# Patient Record
Sex: Male | Born: 1980 | Race: White | Hispanic: No | Marital: Married | Smoking: Never smoker
Health system: Southern US, Community
[De-identification: ages and names within clinical notes are randomized; demographics above are authoritative.]

## PROBLEM LIST (undated history)

## (undated) HISTORY — PX: WISDOM TOOTH EXTRACTION: SHX21

---

## 2017-01-30 NOTE — Progress Notes (Signed)
01/31/2017 1:55 PM   Glenn Kane 26-May-1981 [id number]  Referring provider: No referring provider defined for this encounter.  Chief Complaint  Patient presents with  . VAS Consult    HPI: 36 year old male who presents today for evaluation for possible vasectomy.  He and his wife have 2 elementary school age children and desired no further biological pregnancies. 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  He has no previous history of any scrotal pathology, scrotal trauma, or scrotal pain.  No history of undescended testicles or scrotal surgery.   PMH: History reviewed. No pertinent past medical history.  Surgical History: Past Surgical History:  Procedure Laterality Date  . WISDOM TOOTH EXTRACTION      Home Medications:  Allergies as of 01/31/2017   No Known Allergies     Medication List       Accurate as of 01/31/17  1:55 PM. Always use your most recent med list.          diazepam 10 MG tablet Commonly known as:  VALIUM Take 1 tablet (10 mg total) by mouth every 6 (six) hours as needed for anxiety. Take one tab 1 hour prior to procedue       Allergies: No Known Allergies  Family History: Family History  Problem Relation Age of Onset  . Prostate cancer Father   . Prostate cancer Paternal Grandfather     Social History:  reports that he has never smoked. He has never used smokeless tobacco. He reports that he drinks alcohol. He reports that he does not use drugs.  ROS: UROLOGY Frequent Urination?: No Hard to postpone urination?: No Burning/pain with urination?: No Get up at night to urinate?: No Leakage of urine?: No Urine stream starts and stops?: No Trouble starting stream?: No Do you have to strain to urinate?: No Blood in urine?: No Urinary tract infection?: No Sexually transmitted disease?: No Injury to kidneys or bladder?: No Painful intercourse?: No Weak stream?: No Erection problems?: No Penile pain?:  No  Gastrointestinal Nausea?: No Vomiting?: No Indigestion/heartburn?: No Diarrhea?: No Constipation?: No  Constitutional Fever: No Night sweats?: No Weight loss?: No Fatigue?: No  Skin Skin rash/lesions?: No Itching?: No  Eyes Blurred vision?: No Double vision?: No  Ears/Nose/Throat Sore throat?: No Sinus problems?: No  Hematologic/Lymphatic Swollen glands?: No Easy bruising?: No  Cardiovascular Leg swelling?: No Chest pain?: No  Respiratory Cough?: No Shortness of breath?: No  Endocrine Excessive thirst?: No  Musculoskeletal Back pain?: No Joint pain?: No  Neurological Headaches?: No Dizziness?: No  Psychologic Depression?: No Anxiety?: No  Physical Exam: BP 131/81 (BP Location: Left Arm, Patient Position: Sitting, Cuff Size: Normal)   Pulse 73   Ht 6\' 1"  (1.854 m)   Wt 204 lb 1.6 oz (92.6 kg)   BMI 26.93 kg/m   Constitutional:  Alert and oriented, No acute distress. HEENT: [place] AT, moist mucus membranes.  Trachea midline, no masses. Cardiovascular: No clubbing, cyanosis, or edema. Respiratory: Normal respiratory effort, no increased work of breathing. GI: Abdomen is soft, nontender, nondistended, no abdominal masses GU: Normal circumcised phallus. Bilateral descended testicles, no masses. Somewhat tight scrotum with a posteriorly located vasa bilaterally. Skin: No rashes, bruises or suspicious lesions. Lymph: No cervical or inguinal adenopathy. Neurologic: Grossly intact, no focal deficits, moving all 4 extremities. Psychiatric: Normal mood and affect.  Laboratory Data: N/a  Assessment & Plan:    1. Vasectomy evaluation Today, we discussed what the vas deferens is, where it is located, and  its function. We reviewed the procedure for vasectomy, it's risks, benefits, alternatives, and likelihood of achieving his goals. We discussed in detail the procedure, complications, and recovery as well as the need for clearance prior to unprotected  intercourse. We discussed that vasectomy does not protect against sexually transmitted diseases. We discussed that this procedure does not result in immediate sterility and that they would need to use other forms of birth control until he has been cleared with negative postvasectomy semen analyses. I explained that the procedure is considered to be permanent and that attempts at reversal have varying degrees of success. These options include vasectomy reversal, sperm retrieval, and in vitro fertilization; these can be very expensive. We discussed the chance of postvasectomy pain syndrome which occurs in less than 5% of patients. I explained to the patient that there is no treatment to resolve this chronic pain, and that if it developed I would not be able to help resolve the issue, but that surgery is generally not needed for correction. I explained there have even been reports of systemic like illness associated with this chronic pain, and that there was no good cure. I explained that vasectomy it is not a 100% reliable form of birth control, and the risk of pregnancy after vasectomy is approximately 1 in 2000 men who had a negative postvasectomy semen analysis or rare non-motile sperm. I explained that repeat vasectomy was necessary in less than 1% of vasectomy procedures when employing the type of technique that I use. I explained that he should refrain from ejaculation for approximately one week following vasectomy. I explained that there are other options for birth control which are permanent and non-permanent; we discussed these. I explained the rates of surgical complications, such as symptomatic hematoma or infection, are low (1-2%) and vary with the surgeon's experience and criteria used to diagnose the complication.  The patient had the opportunity to ask questions to his stated satisfaction. He voiced understanding of the above factors and stated that he has read all the information provided to him and  the packets and informed consent.  Precription for Valium 10 mg given to the patient. He will have a driver on the day of the procedure. All additional questions were answered.  Schedule vasectomy.  Vanna ScotlandAshley Kobi Mario, MD  [facility] [address], [address] [place], [place] [address] [phone]

## 2017-01-31 ENCOUNTER — Encounter: Payer: Self-pay | Admitting: Urology

## 2017-01-31 ENCOUNTER — Ambulatory Visit (INDEPENDENT_AMBULATORY_CARE_PROVIDER_SITE_OTHER): Payer: PRIVATE HEALTH INSURANCE | Admitting: Urology

## 2017-01-31 VITALS — BP 131/81 | HR 73 | Ht 73.0 in | Wt 204.1 lb

## 2017-01-31 DIAGNOSIS — Z3009 Encounter for other general counseling and advice on contraception: Secondary | ICD-10-CM

## 2017-01-31 MED ORDER — DIAZEPAM 10 MG PO TABS: 10.0000 mg | ORAL_TABLET | Freq: Four times a day (QID) | ORAL | 0 refills | Status: DC | PRN

## 2017-02-07 ENCOUNTER — Encounter: Payer: Self-pay | Admitting: Urology

## 2017-02-07 ENCOUNTER — Ambulatory Visit (INDEPENDENT_AMBULATORY_CARE_PROVIDER_SITE_OTHER): Payer: PRIVATE HEALTH INSURANCE | Admitting: Urology

## 2017-02-07 VITALS — BP 126/75 | HR 75 | Wt 201.0 lb

## 2017-02-07 DIAGNOSIS — Z302 Encounter for sterilization: Secondary | ICD-10-CM | POA: Diagnosis not present

## 2017-02-07 NOTE — Progress Notes (Signed)
02/07/17  CC:  Chief Complaint  Patient presents with  . VAS    HPI: 36 year old male who presents today for scheduled vasectomy. Risks and benefits or previous reviewed and reviewed again today in detail. Consent had been previously signed. All question answered prior to the procedure.  Blood pressure 126/75, pulse 75, weight 201 lb (91.2 kg). NED. A&Ox3.   No respiratory distress   Abd soft, NT, ND Normal external genitalia with patent urethral meatus   Bilateral Vasectomy Procedure  Pre-Procedure: - Patient's scrotum was prepped and draped for vasectomy. - The vas was palpated through the scrotal skin on the left. - 1% Xylocaine was injected into the skin and surrounding tissue for placement  - In a similar manner, the vas on the right was identified, anesthetized, and stabilized.  Procedure: - An #11 blade was used to open the overlying skin - The left vas was isolated and brought up through the incision exposing that structure. - Bleeding points were cauterized as they occurred. - The vas was free from the surrounding structures and brought to the view. - A segment was positioned for placement with a hemostat. - A second hemostat was placed and a small segment between the two hemostats and was removed for inspection. - Each end of the transected vas lumen was fulgurated/ obliterated using needlepoint electrocautery -A fascial interposition was performed on testicular end of the vas using #3-0 chromic suture -The same procedure was performed on the right. - A single suture of #3-0 chromic catgut was used to close each lateral scrotal skin incision - A dressing was applied.  Post-Procedure: - Patient was instructed in care of the operative area - A specimen is to be delivered in 12 weeks   -Another form of contraception is to be used until post vasectomy semen analysis  Vanna ScotlandAshley Meriam Chojnowski, MD

## 2017-05-15 ENCOUNTER — Other Ambulatory Visit: Payer: PRIVATE HEALTH INSURANCE

## 2017-05-15 DIAGNOSIS — Z9852 Vasectomy status: Secondary | ICD-10-CM

## 2017-05-17 LAB — POST-VAS SPERM EVALUATION,QUAL: Volume: 2.2 mL

## 2017-05-19 ENCOUNTER — Telehealth: Payer: Self-pay

## 2017-05-19 NOTE — Telephone Encounter (Signed)
-----   Message from Vanna ScotlandAshley Brandon, MD sent at 05/17/2017  1:52 PM EDT ----- No sperm.  Good to go.    Vanna ScotlandAshley Brandon, MD

## 2017-05-19 NOTE — Telephone Encounter (Signed)
Called pt. No answer °

## 2017-05-20 NOTE — Telephone Encounter (Signed)
Called patient to give lab results. No answer. Left vmail per DPR.  

## 2018-07-09 ENCOUNTER — Other Ambulatory Visit: Payer: Self-pay

## 2018-07-09 ENCOUNTER — Emergency Department: Payer: BC Managed Care – PPO | Admitting: Anesthesiology

## 2018-07-09 ENCOUNTER — Inpatient Hospital Stay: Admit: 2018-07-09 | Payer: BC Managed Care – PPO | Admitting: Surgery

## 2018-07-09 ENCOUNTER — Observation Stay
Admission: EM | Admit: 2018-07-09 | Discharge: 2018-07-10 | Disposition: A | Discharge status: Home / Self Care | Payer: BC Managed Care – PPO | Attending: Surgery | Admitting: Surgery

## 2018-07-09 ENCOUNTER — Encounter
Admission: EM | Disposition: A | Discharge status: Home / Self Care | Payer: Self-pay | Source: Home / Self Care | Attending: Emergency Medicine

## 2018-07-09 ENCOUNTER — Emergency Department: Payer: BC Managed Care – PPO

## 2018-07-09 ENCOUNTER — Encounter: Payer: Self-pay | Admitting: Intensive Care

## 2018-07-09 DIAGNOSIS — K3580 Unspecified acute appendicitis: Principal | ICD-10-CM | POA: Diagnosis present

## 2018-07-09 HISTORY — PX: LAPAROSCOPIC APPENDECTOMY: SHX408

## 2018-07-09 SURGERY — APPENDECTOMY, LAPAROSCOPIC
Anesthesia: General

## 2018-07-09 MED ORDER — FENTANYL CITRATE (PF) 100 MCG/2ML IJ SOLN
INTRAMUSCULAR | Status: AC
  Filled 2018-07-09: qty 2

## 2018-07-09 MED ORDER — BUPIVACAINE-EPINEPHRINE (PF) 0.25% -1:200000 IJ SOLN
INTRAMUSCULAR | Status: AC
Start: 2018-07-09 — End: ?
  Filled 2018-07-09: qty 30

## 2018-07-09 MED ORDER — HYDROCODONE-ACETAMINOPHEN 5-325 MG PO TABS
1.0000 | ORAL_TABLET | ORAL | Status: DC | PRN
  Administered 2018-07-09: 1 via ORAL
  Administered 2018-07-10: 2 via ORAL
  Filled 2018-07-09 (×3): qty 1

## 2018-07-09 MED ORDER — PROPOFOL 10 MG/ML IV BOLUS
INTRAVENOUS | Status: DC | PRN
  Administered 2018-07-09: 160 mg via INTRAVENOUS

## 2018-07-09 MED ORDER — DEXAMETHASONE SODIUM PHOSPHATE 10 MG/ML IJ SOLN
INTRAMUSCULAR | Status: DC | PRN
  Administered 2018-07-09: 10 mg via INTRAVENOUS

## 2018-07-09 MED ORDER — SUCCINYLCHOLINE CHLORIDE 20 MG/ML IJ SOLN
INTRAMUSCULAR | Status: DC | PRN
  Administered 2018-07-09: 100 mg via INTRAVENOUS

## 2018-07-09 MED ORDER — ACETAMINOPHEN 10 MG/ML IV SOLN
INTRAVENOUS | Status: AC
  Filled 2018-07-09: qty 100

## 2018-07-09 MED ORDER — SUGAMMADEX SODIUM 200 MG/2ML IV SOLN
INTRAVENOUS | Status: DC | PRN
  Administered 2018-07-09: 200 mg via INTRAVENOUS

## 2018-07-09 MED ORDER — FENTANYL CITRATE (PF) 100 MCG/2ML IJ SOLN
INTRAMUSCULAR | Status: AC
  Administered 2018-07-09: 25 ug via INTRAVENOUS
  Filled 2018-07-09: qty 2

## 2018-07-09 MED ORDER — FENTANYL CITRATE (PF) 100 MCG/2ML IJ SOLN
25.0000 ug | INTRAMUSCULAR | Status: DC | PRN
  Administered 2018-07-09 (×4): 25 ug via INTRAVENOUS

## 2018-07-09 MED ORDER — LIDOCAINE HCL (CARDIAC) PF 100 MG/5ML IV SOSY
PREFILLED_SYRINGE | INTRAVENOUS | Status: DC | PRN
  Administered 2018-07-09: 100 mg via INTRAVENOUS

## 2018-07-09 MED ORDER — DEXTROSE-NACL 5-0.9 % IV SOLN
INTRAVENOUS | Status: DC
  Administered 2018-07-09: 22:00:00 via INTRAVENOUS

## 2018-07-09 MED ORDER — ROCURONIUM BROMIDE 100 MG/10ML IV SOLN
INTRAVENOUS | Status: AC
  Filled 2018-07-09: qty 1

## 2018-07-09 MED ORDER — BUPIVACAINE-EPINEPHRINE (PF) 0.25% -1:200000 IJ SOLN
INTRAMUSCULAR | Status: DC | PRN
  Administered 2018-07-09: 30 mL via PERINEURAL

## 2018-07-09 MED ORDER — LIDOCAINE HCL (PF) 2 % IJ SOLN
INTRAMUSCULAR | Status: AC
  Filled 2018-07-09: qty 10

## 2018-07-09 MED ORDER — ONDANSETRON HCL 4 MG/2ML IJ SOLN: 4.0000 mg | Freq: Once | INTRAMUSCULAR | Status: DC | PRN

## 2018-07-09 MED ORDER — IOHEXOL 300 MG/ML  SOLN
100.0000 mL | Freq: Once | INTRAMUSCULAR | Status: AC | PRN
  Administered 2018-07-09: 100 mL via INTRAVENOUS
  Filled 2018-07-09: qty 100

## 2018-07-09 MED ORDER — ROCURONIUM BROMIDE 100 MG/10ML IV SOLN
INTRAVENOUS | Status: DC | PRN
  Administered 2018-07-09: 35 mg via INTRAVENOUS
  Administered 2018-07-09: 5 mg via INTRAVENOUS

## 2018-07-09 MED ORDER — DEXAMETHASONE SODIUM PHOSPHATE 10 MG/ML IJ SOLN
INTRAMUSCULAR | Status: AC
  Filled 2018-07-09: qty 1

## 2018-07-09 MED ORDER — ONDANSETRON HCL 4 MG PO TABS: 4.0000 mg | ORAL_TABLET | Freq: Four times a day (QID) | ORAL | Status: DC | PRN

## 2018-07-09 MED ORDER — SUCCINYLCHOLINE CHLORIDE 20 MG/ML IJ SOLN
INTRAMUSCULAR | Status: AC
  Filled 2018-07-09: qty 1

## 2018-07-09 MED ORDER — ONDANSETRON HCL 4 MG/2ML IJ SOLN
INTRAMUSCULAR | Status: AC
  Filled 2018-07-09: qty 2

## 2018-07-09 MED ORDER — MIDAZOLAM HCL 2 MG/2ML IJ SOLN
INTRAMUSCULAR | Status: DC | PRN
  Administered 2018-07-09: 2 mg via INTRAVENOUS

## 2018-07-09 MED ORDER — FENTANYL CITRATE (PF) 100 MCG/2ML IJ SOLN
INTRAMUSCULAR | Status: DC | PRN
  Administered 2018-07-09: 100 ug via INTRAVENOUS

## 2018-07-09 MED ORDER — HYDROMORPHONE HCL 1 MG/ML IJ SOLN
0.5000 mg | INTRAMUSCULAR | Status: DC | PRN
  Administered 2018-07-10: 0.5 mg via INTRAVENOUS
  Filled 2018-07-09: qty 0.5

## 2018-07-09 MED ORDER — PIPERACILLIN-TAZOBACTAM 3.375 G IVPB 30 MIN
3.3750 g | Freq: Once | INTRAVENOUS | Status: AC
  Administered 2018-07-09: 3.375 g via INTRAVENOUS
  Filled 2018-07-09 (×2): qty 50

## 2018-07-09 MED ORDER — PROPOFOL 10 MG/ML IV BOLUS
INTRAVENOUS | Status: AC
  Filled 2018-07-09: qty 20

## 2018-07-09 MED ORDER — ONDANSETRON HCL 4 MG/2ML IJ SOLN
4.0000 mg | Freq: Four times a day (QID) | INTRAMUSCULAR | Status: DC | PRN
Start: 2018-07-09 — End: 2018-07-10

## 2018-07-09 MED ORDER — LACTATED RINGERS IV SOLN
INTRAVENOUS | Status: DC | PRN
  Administered 2018-07-09: 19:00:00 via INTRAVENOUS

## 2018-07-09 MED ORDER — ONDANSETRON HCL 4 MG/2ML IJ SOLN
INTRAMUSCULAR | Status: DC | PRN
  Administered 2018-07-09: 4 mg via INTRAVENOUS

## 2018-07-09 MED ORDER — ACETAMINOPHEN 10 MG/ML IV SOLN
INTRAVENOUS | Status: DC | PRN
  Administered 2018-07-09: 1000 mg via INTRAVENOUS

## 2018-07-09 MED ORDER — SUGAMMADEX SODIUM 200 MG/2ML IV SOLN
INTRAVENOUS | Status: AC
  Filled 2018-07-09: qty 2

## 2018-07-09 MED ORDER — MIDAZOLAM HCL 2 MG/2ML IJ SOLN
INTRAMUSCULAR | Status: AC
  Filled 2018-07-09: qty 2

## 2018-07-09 SURGICAL SUPPLY — 42 items
ADHESIVE MASTISOL STRL (MISCELLANEOUS) ×3 IMPLANT
APPLIER CLIP ROT 10 11.4 M/L (STAPLE) ×3
BLADE SURG SZ11 CARB STEEL (BLADE) ×3 IMPLANT
CANISTER SUCT 3000ML PPV (MISCELLANEOUS) ×3 IMPLANT
CHLORAPREP W/TINT 26ML (MISCELLANEOUS) ×3 IMPLANT
CLIP APPLIE ROT 10 11.4 M/L (STAPLE) ×1 IMPLANT
CLOSURE WOUND 1/2 X4 (GAUZE/BANDAGES/DRESSINGS) ×1
CUTTER FLEX LINEAR 45M (STAPLE) ×3 IMPLANT
DEVICE TROCAR PUNCTURE CLOSURE (ENDOMECHANICALS) ×3 IMPLANT
ELECT REM PT RETURN 9FT ADLT (ELECTROSURGICAL) ×3
ELECTRODE REM PT RTRN 9FT ADLT (ELECTROSURGICAL) ×1 IMPLANT
GLOVE BIO SURGEON STRL SZ8 (GLOVE) ×6 IMPLANT
GOWN STRL REUS W/ TWL LRG LVL3 (GOWN DISPOSABLE) ×2 IMPLANT
GOWN STRL REUS W/TWL LRG LVL3 (GOWN DISPOSABLE) ×4
IRRIGATION STRYKERFLOW (MISCELLANEOUS) ×1 IMPLANT
IRRIGATOR STRYKERFLOW (MISCELLANEOUS) ×3
KIT TURNOVER KIT A (KITS) ×3 IMPLANT
LABEL OR SOLS (LABEL) IMPLANT
NEEDLE HYPO 22GX1.5 SAFETY (NEEDLE) ×3 IMPLANT
NEEDLE VERESS 14GA 120MM (NEEDLE) ×3 IMPLANT
NS IRRIG 500ML POUR BTL (IV SOLUTION) ×3 IMPLANT
PACK LAP CHOLECYSTECTOMY (MISCELLANEOUS) ×3 IMPLANT
POUCH SPECIMEN RETRIEVAL 10MM (ENDOMECHANICALS) ×3 IMPLANT
RELOAD 45 VASCULAR/THIN (ENDOMECHANICALS) ×6 IMPLANT
RELOAD STAPLE TA45 3.5 REG BLU (ENDOMECHANICALS) ×3 IMPLANT
SCISSORS METZENBAUM CVD 33 (INSTRUMENTS) IMPLANT
SLEEVE ENDOPATH XCEL 5M (ENDOMECHANICALS) ×3 IMPLANT
SLEEVE SCD COMPRESS THIGH MED (MISCELLANEOUS) ×3 IMPLANT
SOL .9 NS 3000ML IRR  AL (IV SOLUTION) ×2
SOL .9 NS 3000ML IRR UROMATIC (IV SOLUTION) ×1 IMPLANT
SPONGE GAUZE 2X2 8PLY STER LF (GAUZE/BANDAGES/DRESSINGS) ×3
SPONGE GAUZE 2X2 8PLY STRL LF (GAUZE/BANDAGES/DRESSINGS) ×6 IMPLANT
SPONGE LAP 18X18 RF (DISPOSABLE) ×3 IMPLANT
STRIP CLOSURE SKIN 1/2X4 (GAUZE/BANDAGES/DRESSINGS) ×2 IMPLANT
SUT MNCRL 4-0 (SUTURE) ×2
SUT MNCRL 4-0 27XMFL (SUTURE) ×1
SUT VICRYL 0 TIES 12 18 (SUTURE) ×3 IMPLANT
SUTURE MNCRL 4-0 27XMF (SUTURE) ×1 IMPLANT
TRAY FOLEY MTR SLVR 16FR STAT (SET/KITS/TRAYS/PACK) ×3 IMPLANT
TROCAR XCEL 12X100 BLDLESS (ENDOMECHANICALS) ×3 IMPLANT
TROCAR XCEL NON-BLD 5MMX100MML (ENDOMECHANICALS) ×3 IMPLANT
TUBING INSUFFLATION (TUBING) ×3 IMPLANT

## 2018-07-09 NOTE — ED Notes (Signed)
Pt denies n/v/d. States he has not eaten anything today. NAD noted.

## 2018-07-09 NOTE — Anesthesia Postprocedure Evaluation (Signed)
Anesthesia Post Note  Patient: Rosezetta Schlatterhomas Allred  Procedure(s) Performed: APPENDECTOMY LAPAROSCOPIC (N/A )  Patient location during evaluation: PACU Anesthesia Type: General Level of consciousness: awake and alert Pain management: pain level controlled Vital Signs Assessment: post-procedure vital signs reviewed and stable Respiratory status: spontaneous breathing and respiratory function stable Cardiovascular status: stable Anesthetic complications: no     Last Vitals:  Vitals:   07/09/18 2105 07/09/18 2115  BP: 112/69 118/74  Pulse: 60 (!) 53  Resp: (!) 0 18  Temp:  36.7 C  SpO2: 96% 100%    Last Pain:  Vitals:   07/09/18 2222  TempSrc:   PainSc: 3                  KEPHART,WILLIAM K

## 2018-07-09 NOTE — Anesthesia Post-op Follow-up Note (Signed)
Anesthesia QCDR form completed.        

## 2018-07-09 NOTE — Op Note (Signed)
laparascopic appendectomy   Glenn Kane Date of operation:  07/09/2018  Indications: The patient presented with a history of  abdominal pain. Workup has revealed findings consistent with acute appendicitis.  Pre-operative Diagnosis: Acute appendicitis  Post-operative Diagnosis: Acute appendicitis, nonruptured  Surgeon: Adah Salvageichard E. Excell Seltzerooper, MD, FACS  Anesthesia: General with endotracheal tube  Procedure Details  The patient was seen again in the preop area. The options of surgery versus observation were reviewed with the patient and/or family. The risks of bleeding, infection, recurrence of symptoms, negative laparoscopy, potential for an open procedure, bowel injury, abscess or infection, were all reviewed as well. The patient was taken to Operating Room, identified as Glenn Kane and the procedure verified as laparoscopic appendectomy. A Time Out was held and the above information confirmed.  The patient was placed in the supine position and general anesthesia was induced.  Antibiotic prophylaxis was administered and VT E prophylaxis was in place. A Foley catheter was placed by the nursing staff.   The abdomen was prepped and draped in a sterile fashion. An infraumbilical incision was made. A Veress needle was placed and pneumoperitoneum was obtained. A 5 mm trocar port was placed without difficulty and the abdominal cavity was explored.  Under direct vision a 5 mm suprapubic port was placed and a 13 mm left lateral port was placed all under direct vision.  The appendix was identified and found to be acutely inflamed and in the retrocecal position as suggested by CT scan. The appendix was carefully dissected. The base of the appendix was dissected out and divided with a standard load Endo GIA. The mesoappendix was divided with a vascular load Endo GIA.  Multiple clips were required along the staple line for arterial bleeding. The appendix was passed out through the left lateral port site  with the aid of an Endo Catch bag. The right lower quadrant and pelvis was then irrigated with copious amounts of normal saline which was aspirated. Inspection  failed to identify any additional bleeding and there were no signs of bowel injury. Therefore the left lateral port site was closed under direct vision utilizing an Endo Close technique with 0 Vicryl interrupted sutures, all under direct vision.   Again the right lower quadrant was inspected there was no sign of bleeding or bowel injury therefore pneumoperitoneum was released, all ports were removed and the skin incisions were approximated with subcuticular 4-0 Monocryl. Steri-Strips and Mastisol and sterile dressings were placed.  The patient tolerated the procedure well, there were no complications. The sponge lap and needle count were correct at the end of the procedure.  The patient was taken to the recovery room in stable condition to be admitted for continued care.  Findings: Acute appendicitis nonruptured in a retrocecal position  Estimated Blood Loss: 25 cc                  Specimens: appendix         Complications: None                  Glenn Kane E. Excell Seltzerooper MD, FACS

## 2018-07-09 NOTE — H&P (Signed)
Glenn Kane is an 37 y.o. male.    Chief Complaint: Right flank pain  HPI: This patient with 2 to 3 days of right flank and right lower quadrant pain.  A work-up in the ED suggested acute appendicitis.  He is never had an episode like this before he has no fevers or chills no nausea or vomiting and no diarrhea. He has no past medical history and no past surgical history other than a sebaceous cyst in college.  He is a Education officer, communitydentist  History reviewed. No pertinent past medical history.  Past Surgical History:  Procedure Laterality Date  . WISDOM TOOTH EXTRACTION      Family History  Problem Relation Age of Onset  . Prostate cancer Father   . Prostate cancer Paternal Grandfather    Social History:  reports that he has never smoked. He has never used smokeless tobacco. He reports that he drinks alcohol. He reports that he does not use drugs.  Allergies: No Known Allergies   (Not in a hospital admission)   Review of Systems  Constitutional: Negative.   HENT: Negative.   Eyes: Negative.   Respiratory: Negative.   Cardiovascular: Negative.   Gastrointestinal: Positive for abdominal pain. Negative for diarrhea, heartburn, nausea and vomiting.  Genitourinary: Negative.   Musculoskeletal: Negative.   Skin: Negative.   Neurological: Negative.   Endo/Heme/Allergies: Negative.   Psychiatric/Behavioral: Negative.      Physical Exam:  BP (!) 138/93 (BP Location: Left Arm)   Pulse 79   Temp 98.1 F (36.7 C) (Oral)   Resp 14   Ht 6\' 1"  (1.854 m)   Wt 90.7 kg   SpO2 100%   BMI 26.39 kg/m   Physical Exam  Constitutional: He is oriented to person, place, and time. He appears well-developed and well-nourished.  Non-toxic appearance. He does not appear ill. No distress.  HENT:  Head: Normocephalic and atraumatic.  Eyes: Pupils are equal, round, and reactive to light. EOM are normal.  Cardiovascular: Normal rate and regular rhythm.  Pulmonary/Chest: Effort normal and breath  sounds normal. No stridor.  Abdominal: Soft. There is tenderness in the right lower quadrant. There is no guarding.  Right lower quadrant and right flank tenderness without mass and a negative Rovsing sign  Genitourinary: Penis normal.  Neurological: He is alert and oriented to person, place, and time.  Skin: Skin is warm and dry.  Vitals reviewed.       No results found for this or any previous visit (from the past 48 hour(s)). Ct Abdomen Pelvis W Contrast  Result Date: 07/09/2018 CLINICAL DATA:  Patient is here for right sided abdominal pain X2 days. [facility] and they sent patient here for elevated WBC and possible appendicitis. No surgery. NKI. EXAM: CT ABDOMEN AND PELVIS WITH CONTRAST TECHNIQUE: Multidetector CT imaging of the abdomen and pelvis was performed using the standard protocol following bolus administration of intravenous contrast. CONTRAST:  100mL OMNIPAQUE IOHEXOL 300 MG/ML  SOLN COMPARISON:  None. FINDINGS: Lower chest: Calcified granuloma at the LEFT lung base. Lung bases are otherwise unremarkable. Heart size is normal. No pericardial effusion or significant coronary artery calcifications. Hepatobiliary: No focal liver abnormality is seen. No radiopaque gallstones, biliary dilatation, or pericholecystic inflammatory changes. Pancreas: Unremarkable. No pancreatic ductal dilatation or surrounding inflammatory changes. Spleen: Normal in size without focal abnormality. Adrenals/Urinary Tract: Symmetric enhancement of both kidneys. No renal mass. No hydronephrosis. Urinary bladder is unremarkable. Stomach/Bowel: The stomach is normal in appearance. Small bowel loops are  nondilated. There is thickening of the retrocecal appendix and associated periappendiceal stranding. The appendix measures 13 millimeters. No appendicolith or abscess in the RIGHT LOWER QUADRANT. Vascular/Lymphatic: No significant vascular findings are present. No enlarged abdominal or pelvic lymph nodes.  Reproductive: Prostate is unremarkable. Other: There is a small amount of free pelvic fluid. Anterior abdominal wall is unremarkable. Musculoskeletal: No acute or significant osseous findings. IMPRESSION: 1. Findings are consistent with acute appendicitis. 2. Appendix: Location: Retrocecal Diameter: 13 millimeters Appendicolith: None Mucosal hyper-enhancement: Present Extraluminal gas: None Periappendiceal collection: Nonspecific stranding and fluid in the RIGHT LOWER QUADRANT and pelvis. No abscess. Electronically Signed   By: Norva Pavlov M.D.   On: 07/09/2018 16:50     Assessment/Plan  CT scan personally reviewed showing a retrocecal appendix.  Recommend laparoscopic appendectomy.  I discussed with him the potential for treating with antibiotics but he has chosen after counseling surgical intervention at this time.  I described for he and his wife the procedure and I discussed the risks of bleeding infection recurrence of symptoms conversion to an open procedure he understood and agreed to proceed  Lattie Haw, MD, FACS

## 2018-07-09 NOTE — Transfer of Care (Signed)
Immediate Anesthesia Transfer of Care Note  Patient: Glenn Kane  Procedure(s) Performed: APPENDECTOMY LAPAROSCOPIC (N/A )  Patient Location: PACU  Anesthesia Type:General  Level of Consciousness: sedated  Airway & Oxygen Therapy: Patient connected to face mask oxygen  Post-op Assessment: Post -op Vital signs reviewed and stable  Post vital signs: stable  Last Vitals:  Vitals Value Taken Time  BP 116/76 07/09/2018  8:16 PM  Temp 36.4 C 07/09/2018  8:16 PM  Pulse 76 07/09/2018  8:16 PM  Resp 19 07/09/2018  8:16 PM  SpO2 100 % 07/09/2018  8:16 PM    Last Pain:  Vitals:   07/09/18 2016  TempSrc: Temporal  PainSc:          Complications: No apparent anesthesia complications

## 2018-07-09 NOTE — Anesthesia Procedure Notes (Signed)
Procedure Name: Intubation Date/Time: 07/09/2018 7:02 PM Performed by: Aline Brochure, CRNA Pre-anesthesia Checklist: Patient identified, Emergency Drugs available, Suction available and Patient being monitored Patient Re-evaluated:Patient Re-evaluated prior to induction Oxygen Delivery Method: Circle system utilized Preoxygenation: Pre-oxygenation with 100% oxygen Induction Type: IV induction Ventilation: Mask ventilation without difficulty Laryngoscope Size: Mac and 4 Grade View: Grade I Tube type: Oral Tube size: 7.5 mm Number of attempts: 1 Airway Equipment and Method: Stylet Placement Confirmation: ETT inserted through vocal cords under direct vision,  positive ETCO2 and breath sounds checked- equal and bilateral Secured at: 21 cm Tube secured with: Tape Dental Injury: Teeth and Oropharynx as per pre-operative assessment

## 2018-07-09 NOTE — ED Provider Notes (Signed)
[facility] Emergency Department Provider Note       Time seen: ----------------------------------------- 4:13 PM on 07/09/2018 -----------------------------------------   I have reviewed the triage vital signs and the nursing notes.  HISTORY   Chief Complaint Abdominal Pain (right sided)    HPI Glenn Kane is a 37 y.o. male with no significant past medical history who presents to the ED for right lower quadrant pain.  Patient presents for right-sided abdominal pain for the past 2 days.  [facility] and he was sent here for evaluation for possible appendicitis.  His white blood cell count was noted to be elevated.  He denies any nausea, vomiting or other complaints.  He does have pain is worse with movement or deep breathing.  History reviewed. No pertinent past medical history.  There are no active problems to display for this patient.   Past Surgical History:  Procedure Laterality Date  . WISDOM TOOTH EXTRACTION      Allergies Patient has no known allergies.  Social History Social History   Tobacco Use  . Smoking status: Never Smoker  . Smokeless tobacco: Never Used  Substance Use Topics  . Alcohol use: Yes    Comment: weekends  . Drug use: No   Review of Systems Constitutional: Negative for fever. Cardiovascular: Negative for chest pain. Respiratory: Negative for shortness of breath. Gastrointestinal: Positive for abdominal pain Musculoskeletal: Negative for back pain. Skin: Negative for rash. Neurological: Negative for headaches, focal weakness or numbness.  All systems negative/normal/unremarkable except as stated in the HPI  ____________________________________________   PHYSICAL EXAM:  VITAL SIGNS: ED Triage Vitals [07/09/18 1526]  Enc Vitals Group     BP 128/78     Pulse Rate 89     Resp 14     Temp 98.1 F (36.7 C)     Temp Source Oral     SpO2 98 %     Weight 200 lb (90.7 kg)      Height 6\' 1"  (1.854 m)     Head Circumference      Peak Flow      Pain Score 6     Pain Loc      Pain Edu?      Excl. in GC?    Constitutional: Alert and oriented. Well appearing and in no distress. Eyes: Conjunctivae are normal. Normal extraocular movements. ENT   Head: Normocephalic and atraumatic.   Nose: No congestion/rhinnorhea.   Mouth/Throat: Mucous membranes are moist.   Neck: No stridor. Cardiovascular: Normal rate, regular rhythm. No murmurs, rubs, or gallops. Respiratory: Normal respiratory effort without tachypnea nor retractions. Breath sounds are clear and equal bilaterally. No wheezes/rales/rhonchi. Gastrointestinal: McBurney's point tenderness, hypoactive bowel sounds Musculoskeletal: Nontender with normal range of motion in extremities. No lower extremity tenderness nor edema. Neurologic:  Normal speech and language. No gross focal neurologic deficits are appreciated.  Skin:  Skin is warm, dry and intact. No rash noted. Psychiatric: Mood and affect are normal. Speech and behavior are normal.  ____________________________________________  ED COURSE:  As part of my medical decision making, I reviewed the following data within the electronic MEDICAL RECORD NUMBER History obtained from family if available, nursing notes, old chart and ekg, as well as notes from prior ED visits. Patient presented for abdominal pain, we will assess with labs and imaging as indicated at this time.   Procedures ____________________________________________   RADIOLOGY Images were viewed by me  Ct abd/pelvis  IMPRESSION: 1. Findings are  consistent with acute appendicitis. 2. Appendix: Location: Retrocecal Diameter: 13 millimeters Appendicolith: None Mucosal hyper-enhancement: Present Extraluminal gas: None Periappendiceal collection: Nonspecific stranding and fluid in the RIGHT LOWER QUADRANT and pelvis. No  abscess. ____________________________________________  DIFFERENTIAL DIAGNOSIS   Appendicitis, renal colic, intestinal pain  FINAL ASSESSMENT AND PLAN  Acute appendicitis   Plan: The patient had presented for right lower quadrant pain. Patient's labs did revealed mild leukocytosis. Patient's imaging revealed retrocecal dilated appendix with stranding consistent with acute appendicitis.  I have ordered IV Zosyn for him and have discussed with Dr. Excell Seltzer from general surgery who will come and evaluate the patient in the ER.   Ulice Dash, MD   Note: This note was generated in part or whole with voice recognition software. Voice recognition is usually quite accurate but there are transcription errors that can and very often do occur. I apologize for any typographical errors that were not detected and corrected.     Emily Filbert, MD 07/09/18 [phone]

## 2018-07-09 NOTE — Progress Notes (Deleted)
Care RN called 2A, Orthopedics charge nurse to conference regarding "short leg splint"; stated that the ED ortho tech needed to have had this done earlier and that staff RN needed to wrap her leg up in ACE wrap and elevated on 2 pillows and ice pack applied; concurred with plan; Peighton Edgin K, RN 8:48 PM 07/09/2018 

## 2018-07-09 NOTE — ED Triage Notes (Signed)
Patient is here for right sided abdominal pain X2 days. [facility] and they sent patient here for elevated WBC and possible appendicitis. Patient able to ambulate with no problems. Denies N/V/D

## 2018-07-09 NOTE — Anesthesia Preprocedure Evaluation (Addendum)
Anesthesia Evaluation  Patient identified by MRN, date of birth, ID band Patient awake    Reviewed: Allergy & Precautions, NPO status , Patient's Chart, lab work & pertinent test results  History of Anesthesia Complications Negative for: history of anesthetic complications  Airway Mallampati: II       Dental   Pulmonary neg sleep apnea, neg COPD,           Cardiovascular (-) hypertension(-) Past MI and (-) CHF (-) dysrhythmias (-) Valvular Problems/Murmurs     Neuro/Psych neg Seizures    GI/Hepatic Neg liver ROS, neg GERD  ,  Endo/Other  neg diabetes  Renal/GU negative Renal ROS     Musculoskeletal   Abdominal   Peds  Hematology   Anesthesia Other Findings   Reproductive/Obstetrics                             Anesthesia Physical Anesthesia Plan  ASA: I and emergent  Anesthesia Plan: General   Post-op Pain Management:    Induction: Intravenous  PONV Risk Score and Plan: 2 and Dexamethasone and Ondansetron  Airway Management Planned: Oral ETT  Additional Equipment:   Intra-op Plan:   Post-operative Plan:   Informed Consent: I have reviewed the patients History and Physical, chart, labs and discussed the procedure including the risks, benefits and alternatives for the proposed anesthesia with the patient or authorized representative who has indicated his/her understanding and acceptance.     Plan Discussed with:   Anesthesia Plan Comments:         Anesthesia Quick Evaluation

## 2018-07-09 NOTE — ED Notes (Signed)
Pt given gown and asked to change into it, removing all his other clothing. Pt also given 2 warm blankets. Answered all pt and spouse's questions.

## 2018-07-10 ENCOUNTER — Encounter: Payer: Self-pay | Admitting: Surgery

## 2018-07-10 MED ORDER — HYDROCODONE-ACETAMINOPHEN 5-325 MG PO TABS: 1.0000 | ORAL_TABLET | Freq: Four times a day (QID) | ORAL | 0 refills | Status: DC | PRN

## 2018-07-10 NOTE — Discharge Instructions (Signed)
Remove dressing in 24 hours. °May shower in 24 hours. °Leave paper strips in place. °Resume all home medications. °Follow-up with Dr. Maymunah Stegemann in 10 days. °

## 2018-07-10 NOTE — Progress Notes (Signed)
Patient cleared for discharge by Dr Excell Seltzerooper      Education complete. AVS printed. Discharge instructions given. All questions answered for patient clarification.  Prescriptions given, pharmacy verified.  IV removed.  Discharged to home via POV

## 2018-07-10 NOTE — Progress Notes (Signed)
1 Day Post-Op  Subjective: Status post laparoscopic appendectomy last night for nonruptured appendicitis.  Today he feels well and wants to eat and be discharged.  Objective: Vital signs in last 24 hours: Temp:  [97.3 F (36.3 C)-98.1 F (36.7 C)] 97.6 F (36.4 C) (08/09 0327) Pulse Rate:  [53-90] 57 (08/09 0327) Resp:  [0-19] 18 (08/09 0327) BP: (112-138)/(69-93) 112/71 (08/09 0327) SpO2:  [95 %-100 %] 99 % (08/09 0327) Weight:  [90.7 kg] 90.7 kg (08/08 1526)    Intake/Output from previous day: 08/08 0701 - 08/09 0700 In: 1145 [I.V.:1090; IV Piggyback:55] Out: 950 [Urine:950] Intake/Output this shift: No intake/output data recorded.  Physical exam:  Abdomen is soft wounds are dressed minimally tender in the left lower quadrant incision site.  Lab Results: CBC  No results for input(s): WBC, HGB, HCT, PLT in the last 72 hours. BMET No results for input(s): NA, K, CL, CO2, GLUCOSE, BUN, CREATININE, CALCIUM in the last 72 hours. PT/INR No results for input(s): LABPROT, INR in the last 72 hours. ABG No results for input(s): PHART, HCO3 in the last 72 hours.  Invalid input(s): PCO2, PO2  Studies/Results: Ct Abdomen Pelvis W Contrast  Result Date: 07/09/2018 CLINICAL DATA:  Patient is here for right sided abdominal pain X2 days. [facility], and they sent patient here for elevated WBC and possible appendicitis. No surgery. NKI. EXAM: CT ABDOMEN AND PELVIS WITH CONTRAST TECHNIQUE: Multidetector CT imaging of the abdomen and pelvis was performed using the standard protocol following bolus administration of intravenous contrast. CONTRAST:  100mL OMNIPAQUE IOHEXOL 300 MG/ML  SOLN COMPARISON:  None. FINDINGS: Lower chest: Calcified granuloma at the LEFT lung base. Lung bases are otherwise unremarkable. Heart size is normal. No pericardial effusion or significant coronary artery calcifications. Hepatobiliary: No focal liver abnormality is seen. No radiopaque gallstones, biliary  dilatation, or pericholecystic inflammatory changes. Pancreas: Unremarkable. No pancreatic ductal dilatation or surrounding inflammatory changes. Spleen: Normal in size without focal abnormality. Adrenals/Urinary Tract: Symmetric enhancement of both kidneys. No renal mass. No hydronephrosis. Urinary bladder is unremarkable. Stomach/Bowel: The stomach is normal in appearance. Small bowel loops are nondilated. There is thickening of the retrocecal appendix and associated periappendiceal stranding. The appendix measures 13 millimeters. No appendicolith or abscess in the RIGHT LOWER QUADRANT. Vascular/Lymphatic: No significant vascular findings are present. No enlarged abdominal or pelvic lymph nodes. Reproductive: Prostate is unremarkable. Other: There is a small amount of free pelvic fluid. Anterior abdominal wall is unremarkable. Musculoskeletal: No acute or significant osseous findings. IMPRESSION: 1. Findings are consistent with acute appendicitis. 2. Appendix: Location: Retrocecal Diameter: 13 millimeters Appendicolith: None Mucosal hyper-enhancement: Present Extraluminal gas: None Periappendiceal collection: Nonspecific stranding and fluid in the RIGHT LOWER QUADRANT and pelvis. No abscess. Electronically Signed   By: Norva PavlovElizabeth  Brown M.D.   On: 07/09/2018 16:50    Anti-infectives: Anti-infectives (From admission, onward)   Start     Dose/Rate Route Frequency Ordered Stop   07/09/18 1730  piperacillin-tazobactam (ZOSYN) IVPB 3.375 g     3.375 g 100 mL/hr over 30 Minutes Intravenous  Once 07/09/18 1703 07/09/18 1745      Assessment/Plan: s/p Procedure(s): APPENDECTOMY LAPAROSCOPIC   Patient doing very well will advance diet and discharge this morning.  He will follow-up in 10 days.  He is given instructions concerning removing his dressings tomorrow and showering tomorrow.  He can return to work when off narcotics.  Lattie Hawichard E Sharifah Champine, MD, FACS  07/10/2018

## 2018-07-11 LAB — HIV ANTIBODY (ROUTINE TESTING W REFLEX): HIV Screen 4th Generation wRfx: NONREACTIVE

## 2018-07-13 LAB — SURGICAL PATHOLOGY

## 2018-07-20 ENCOUNTER — Ambulatory Visit (INDEPENDENT_AMBULATORY_CARE_PROVIDER_SITE_OTHER): Payer: BC Managed Care – PPO | Admitting: Surgery

## 2018-07-20 ENCOUNTER — Encounter: Payer: Self-pay | Admitting: Surgery

## 2018-07-20 VITALS — BP 119/80 | HR 99 | Temp 98.7°F | Wt 207.0 lb

## 2018-07-20 DIAGNOSIS — K3589 Other acute appendicitis without perforation or gangrene: Secondary | ICD-10-CM

## 2018-07-20 NOTE — Progress Notes (Signed)
Outpatient postop visit  07/20/2018  Glenn Kane is an 37 y.o. male.    Procedure: lap appy CC:Min LLQ pain  HPI: s/p lap appy. Path reviewed. Pt feels well and has been working since last week Medications reviewed.    Physical Exam:  There were no vitals taken for this visit.    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 No problems, f/u as needed  Lattie Hawichard E Cooper, MD, FACS

## 2018-07-20 NOTE — Patient Instructions (Signed)

## 2019-01-26 IMAGING — CT CT ABD-PELV W/ CM
2 of 4 series · 16 of 46 positions shown, 18 images · IV contrast (APPLIED)
Comparison: None.

CLINICAL DATA: Patient is here for right sided abdominal pain X2
days. Was seen at [facility], and they sent patient here for elevated
WBC and possible appendicitis. No surgery. NKI.

EXAM:
CT ABDOMEN AND PELVIS WITH CONTRAST
TECHNIQUE: Multidetector CT imaging of the abdomen and pelvis was performed
using the standard protocol following bolus administration of
intravenous contrast.
CONTRAST:  100mL OMNIPAQUE IOHEXOL 300 MG/ML  SOLN

[Series 2: routine abd/pel with · axial · 0.77mm/px · z∈[-181,+304]mm · 13 of 107 slices shown, 15 images]
[im 5/107  soft-tissue]
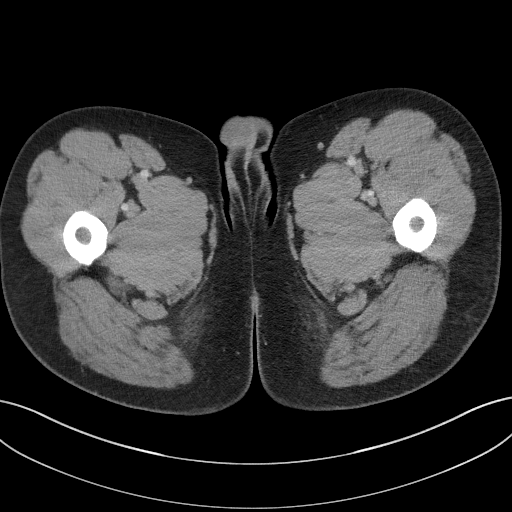
[im 5/107  bone]
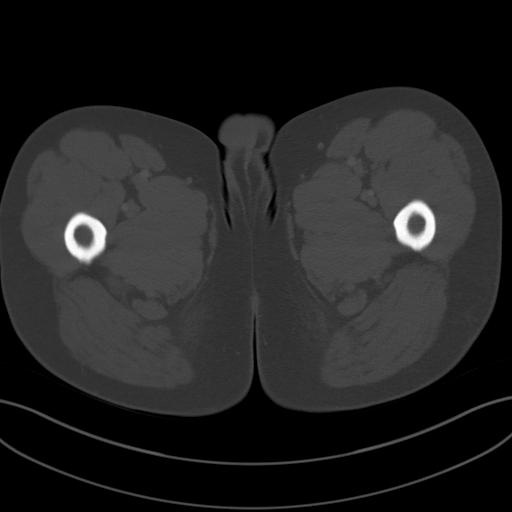
[im 15/107  soft-tissue]
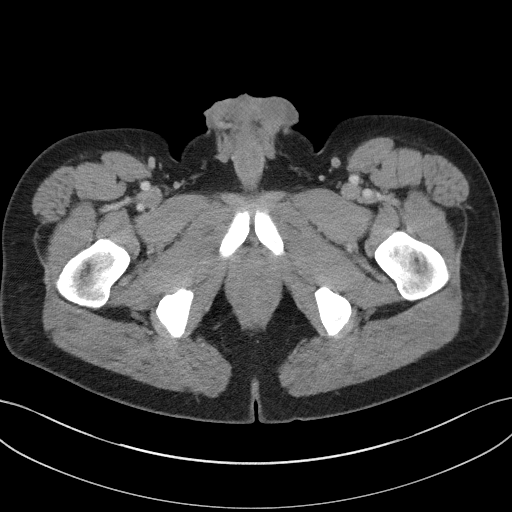
[im 25/107  soft-tissue]
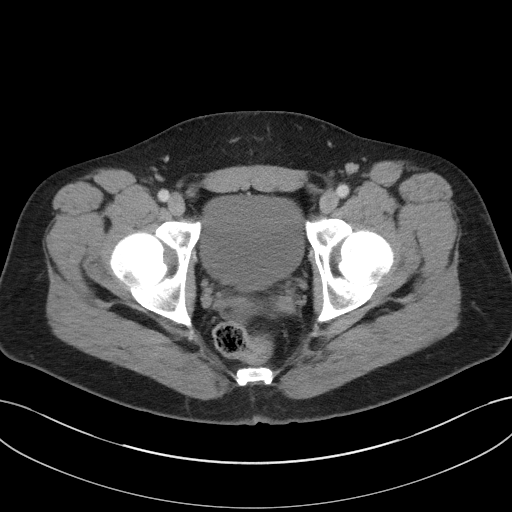
[im 29/107  soft-tissue]
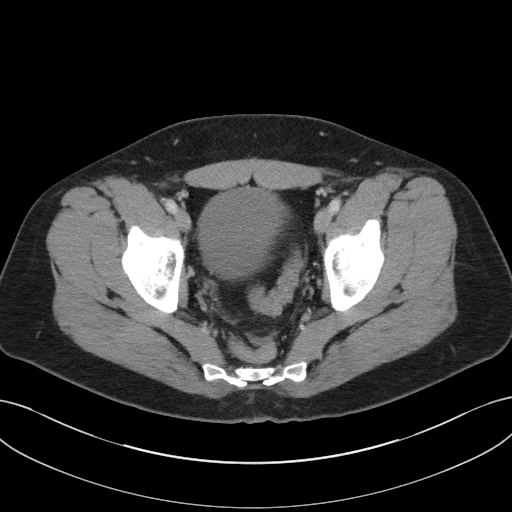
[im 39/107  soft-tissue]
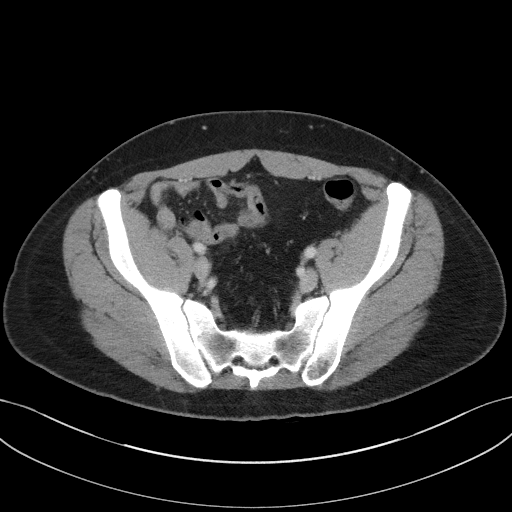
[im 44/107  soft-tissue]
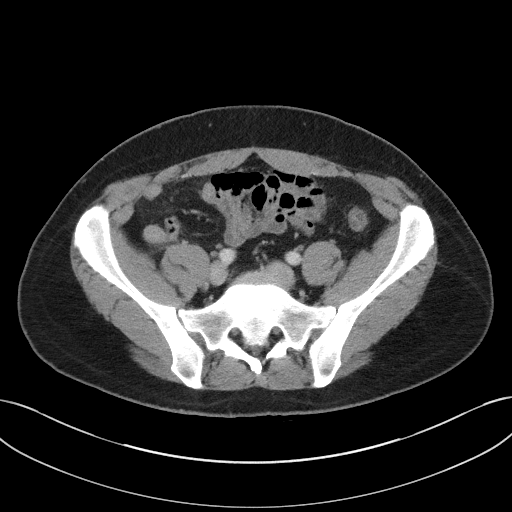
[im 54/107  soft-tissue]
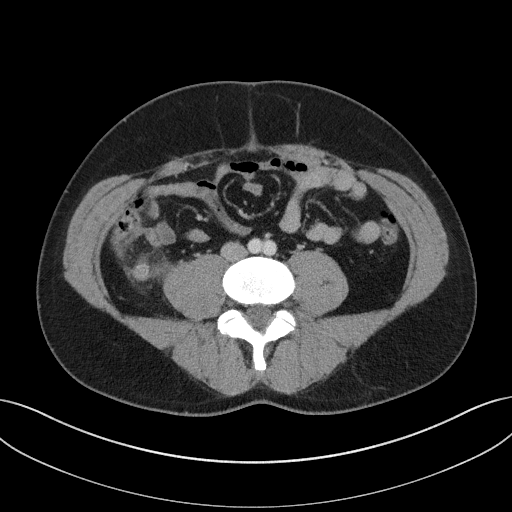
[im 63/107  soft-tissue]
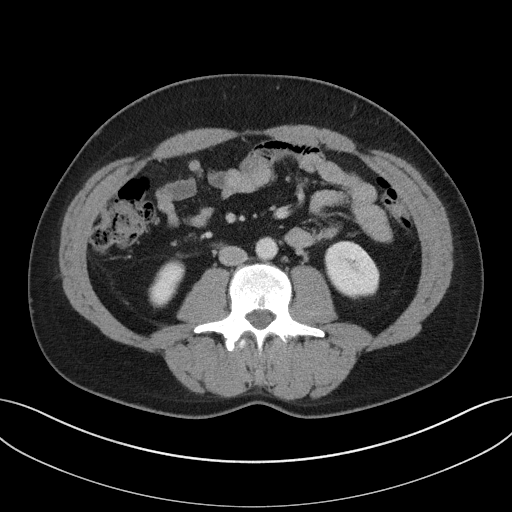
[im 68/107  soft-tissue]
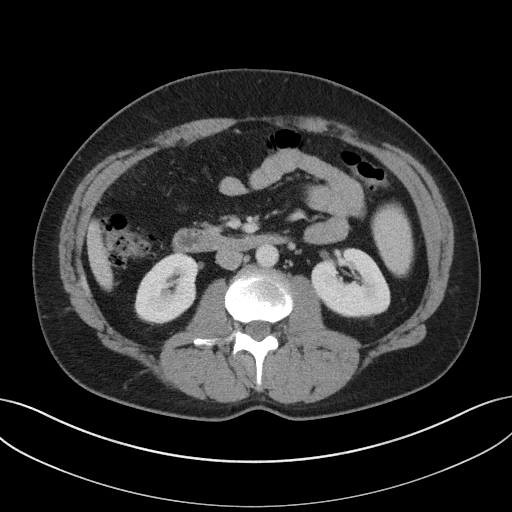
[im 68/107  bone]
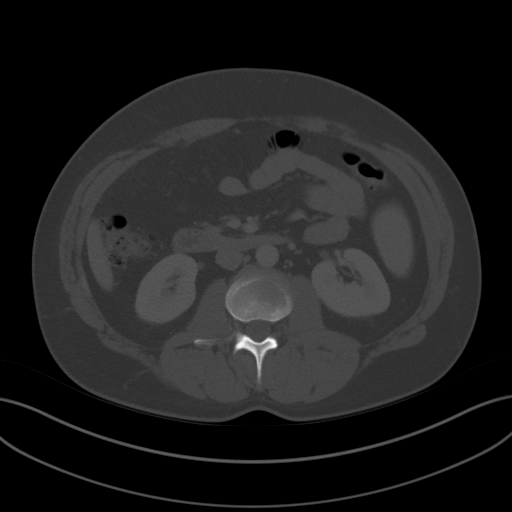
[im 78/107  soft-tissue]
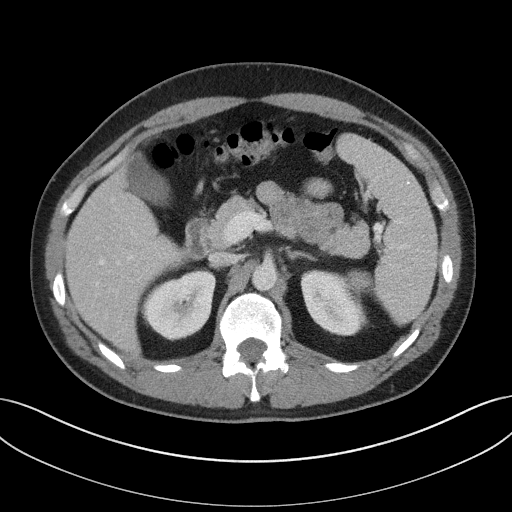
[im 82/107  soft-tissue]
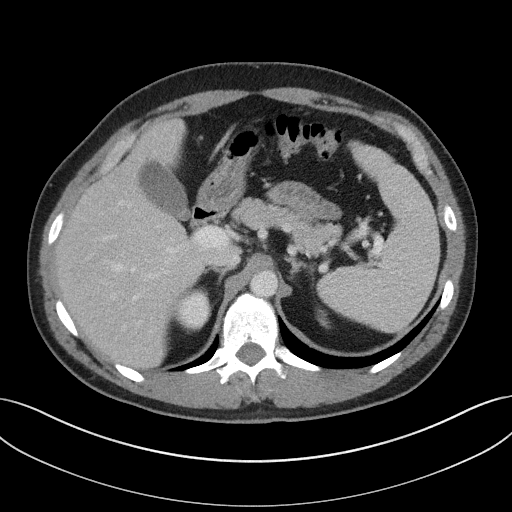
[im 92/107  soft-tissue]
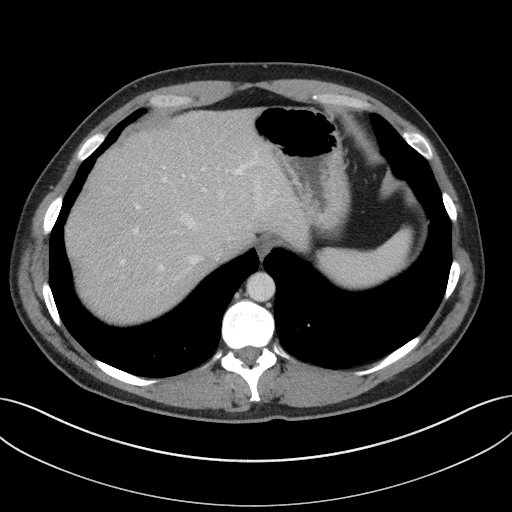
[im 102/107  soft-tissue]
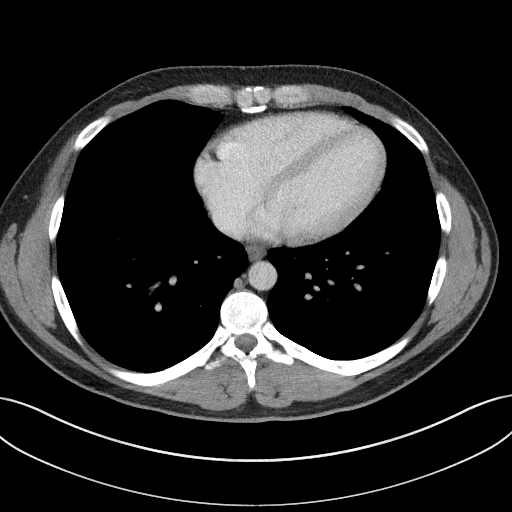

[Series 5: coronal st · coronal · 0.83mm/px · 3 of 88 slices shown]
[im 30/88  soft-tissue]
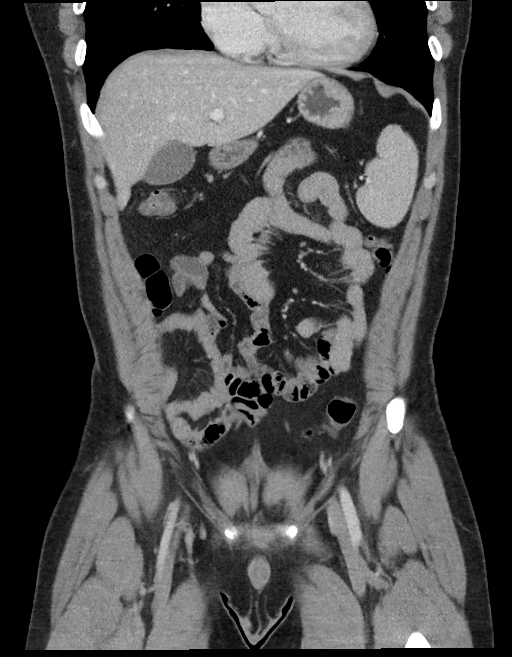
[im 39/88  soft-tissue]
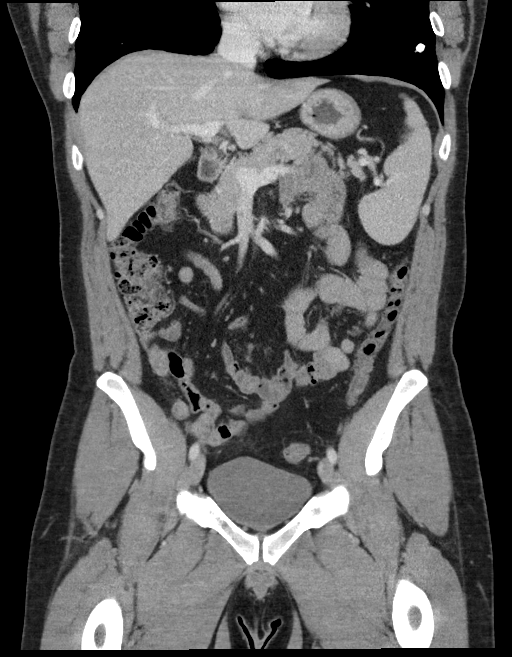
[im 49/88  soft-tissue]
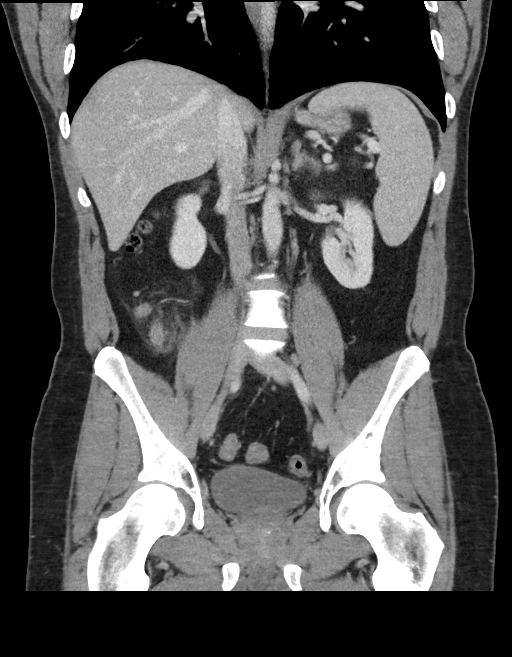

[16 of 46 positions shown; findings below may reference images not displayed]

FINDINGS: Lower chest: Calcified granuloma at the LEFT lung base. Lung bases
are otherwise unremarkable. Heart size is normal. No pericardial
effusion or significant coronary artery calcifications.

Hepatobiliary: No focal liver abnormality is seen. No radiopaque
gallstones, biliary dilatation, or pericholecystic inflammatory
changes.

Pancreas: Unremarkable. No pancreatic ductal dilatation or
surrounding inflammatory changes.

Spleen: Normal in size without focal abnormality.

Adrenals/Urinary Tract: Symmetric enhancement of both kidneys. No
renal mass. No hydronephrosis. Urinary bladder is unremarkable.

Stomach/Bowel: The stomach is normal in appearance. Small bowel
loops are nondilated.

There is thickening of the retrocecal appendix and associated
periappendiceal stranding. The appendix measures 13 millimeters. No
appendicolith or abscess in the RIGHT LOWER QUADRANT.

Vascular/Lymphatic: No significant vascular findings are present. No
enlarged abdominal or pelvic lymph nodes.

Reproductive: Prostate is unremarkable.

Other: There is a small amount of free pelvic fluid. Anterior
abdominal wall is unremarkable.

Musculoskeletal: No acute or significant osseous findings.
IMPRESSION: 1. Findings are consistent with acute appendicitis.
2. Appendix: Location: Retrocecal
Diameter: 13 millimeters
Appendicolith: None
Mucosal hyper-enhancement: Present
Extraluminal gas: None
Periappendiceal collection: Nonspecific stranding and fluid in the
RIGHT LOWER QUADRANT and pelvis. No abscess.

## 2021-10-30 NOTE — Progress Notes (Signed)
10/31/21 3:53 PM   [name] 1980/12/13 [id number]  Referring provider:  No referring provider defined for this encounter. Chief Complaint  Patient presents with   family history of prostate cancer     HPI: Glenn Kane is a 40 y.o.male with a personal history of vasectomy, who presents today to establish care.  He was last seen in clinic in 2018 for vasectomy.   He has a family history of prostate cancer.  He reports today that his father and grandfather were diagnosed with prostate cancer in there late 60's. His father underwent a prostatectomy.   No baseline urinary symptoms.      IPSS     Row Name 10/31/21 1500         International Prostate Symptom Score   How often have you had the sensation of not emptying your bladder? Not at All     How often have you had to urinate less than every two hours? Not at All     How often have you found you stopped and started again several times when you urinated? Not at All     How often have you found it difficult to postpone urination? Not at All     How often have you had a weak urinary stream? Not at All     How often have you had to strain to start urination? Not at All     How many times did you typically get up at night to urinate? None     Total IPSS Score 0       Quality of Life due to urinary symptoms   If you were to spend the rest of your life with your urinary condition just the way it is now how would you feel about that? Pleased              Score:  1-7 Mild 8-19 Moderate 20-35 Severe   PMH: No past medical history on file.  Surgical History: Past Surgical History:  Procedure Laterality Date   LAPAROSCOPIC APPENDECTOMY N/A 07/09/2018   Procedure: APPENDECTOMY LAPAROSCOPIC;  Surgeon: Lattie Haw, MD;  Location: [facility] ORS;  Service: General;  Laterality: N/A;   WISDOM TOOTH EXTRACTION      Home Medications:  Allergies as of 10/31/2021   No Known Allergies      Medication List         Accurate as of October 31, 2021  3:53 PM. If you have any questions, ask your nurse or doctor.          STOP taking these medications    HYDROcodone-acetaminophen 5-325 MG tablet Commonly known as: NORCO/VICODIN Stopped by: Vanna Scotland, MD   itraconazole 100 MG capsule Commonly known as: SPORANOX Stopped by: Vanna Scotland, MD        Allergies: No Known Allergies  Family History: Family History  Problem Relation Age of Onset   Prostate cancer Father    Prostate cancer Paternal Grandfather     Social History:  reports that he has never smoked. He has never used smokeless tobacco. He reports current alcohol use. He reports that he does not use drugs.   Physical Exam: BP 133/83   Pulse 80   Ht 6\' 1"  (1.854 m)   Wt 207 lb (93.9 kg)   BMI 27.31 kg/m   Constitutional:  Alert and oriented, No acute distress. HEENT: [place] AT, moist mucus membranes.  Trachea midline, no masses. Cardiovascular: No clubbing, cyanosis, or edema. Respiratory: Normal respiratory  effort, no increased work of breathing. Rectal: Normal sphincter tone,  20  CC prostate, smooth no nodules Skin: No rashes, bruises or suspicious lesions. Neurologic: Grossly intact, no focal deficits, moving all 4 extremities. Psychiatric: Normal mood and affect.   Assessment & Plan:    Prostate cancer screening  - Discussed  NCCN/ AUA guideline screening for prostate cancer screening. We will discuss guideline further after we receive PSA results. - Rectal exam benign  - PSA; pending   Will call with PSA results   I,Kailey Littlejohn,acting as a scribe for Vanna Scotland, MD.,have documented all relevant documentation on the behalf of Vanna Scotland, MD,as directed by  Vanna Scotland, MD while in the presence of Vanna Scotland, MD.  I have reviewed the above documentation for accuracy and completeness, and I agree with the above.   Vanna Scotland, MD   [facility] [address], [address] [place], [place] [address] [phone]

## 2021-10-31 ENCOUNTER — Other Ambulatory Visit: Payer: Self-pay

## 2021-10-31 ENCOUNTER — Ambulatory Visit: Payer: BC Managed Care – PPO | Admitting: Urology

## 2021-10-31 ENCOUNTER — Encounter: Payer: Self-pay | Admitting: Urology

## 2021-10-31 VITALS — BP 133/83 | HR 80 | Ht 73.0 in | Wt 207.0 lb

## 2021-10-31 DIAGNOSIS — Z8042 Family history of malignant neoplasm of prostate: Secondary | ICD-10-CM

## 2021-11-01 ENCOUNTER — Telehealth: Payer: Self-pay | Admitting: Urology

## 2021-11-01 LAB — URINALYSIS, COMPLETE
Bilirubin, UA: NEGATIVE
Glucose, UA: NEGATIVE
Ketones, UA: NEGATIVE
Leukocytes,UA: NEGATIVE
Nitrite, UA: NEGATIVE
Protein,UA: NEGATIVE
RBC, UA: NEGATIVE
Specific Gravity, UA: <1.005 — ABNORMAL LOW (ref 1.005–1.030)
Urobilinogen, Ur: 0.2 mg/dL (ref 0.2–1.0)
pH, UA: 5.5 (ref 5.0–7.5)

## 2021-11-01 LAB — MICROSCOPIC EXAMINATION
Bacteria, UA: NONE SEEN
Epithelial Cells (non renal): NONE SEEN /hpf (ref 0–10)
RBC: NONE SEEN /hpf (ref 0–2)

## 2021-11-01 LAB — PSA: Prostate Specific Ag, Serum: 0.4 ng/mL (ref 0.0–4.0)

## 2021-11-01 NOTE — Telephone Encounter (Signed)
Patient advised, voiced understanding.

## 2021-11-01 NOTE — Telephone Encounter (Signed)
PSA is appropriately low at 0.4 as expected!    Recommend repeating PSA again at age 40.    Vanna Scotland, MD

## 2024-08-10 ENCOUNTER — Ambulatory Visit (INDEPENDENT_AMBULATORY_CARE_PROVIDER_SITE_OTHER): Admitting: Podiatry

## 2024-08-10 VITALS — Ht 73.0 in | Wt 207.0 lb

## 2024-08-10 DIAGNOSIS — L84 Corns and callosities: Secondary | ICD-10-CM

## 2024-08-10 NOTE — Progress Notes (Signed)
   Chief Complaint  Patient presents with   Blister    Pt is here due to blister on the side of his right great toenail, he states that it come and goes, doesn't remember when it started, hurts at times especially while wearing certain shoes.    HPI: 43 y.o. male presenting today for evaluation of a blister that developed to the right great toe.  Idiopathic onset.  No history of injury  No past medical history on file.  Past Surgical History:  Procedure Laterality Date   LAPAROSCOPIC APPENDECTOMY N/A 07/09/2018   Procedure: APPENDECTOMY LAPAROSCOPIC;  Surgeon: Wonda Charlie BRAVO, MD;  Location: [facility] ORS;  Service: General;  Laterality: N/A;   WISDOM TOOTH EXTRACTION      No Known Allergies   Physical Exam: General: The patient is alert and oriented x3 in no acute distress.  Dermatology: Skin is warm, dry and supple bilateral lower extremities.  Hyperkeratotic lesion noted with dried blood at the central portion.  No open wound noted after debridement.  Vascular: Palpable pedal pulses bilaterally. Capillary refill within normal limits.  No appreciable edema.  No erythema.  Neurological: Grossly intact via light touch  Musculoskeletal Exam: No pedal structural deformity   Assessment/Plan of Care: 1.  Hemorrhagic callus medial aspect right great toe  -Patient evaluated -Light excisional debridement of the lesion was performed today using a 312 scalpel without incident or bleeding -Recommend shoes that do not irritate or agitate the medial aspect of the great toe -Lesion should resolve uneventfully -Return to clinic PRN      Thresa EMERSON Sar, DPM [facility]  Dr. Thresa EMERSON Sar, DPM    [address] [place], [place] [address]                Office [phone]  Fax [phone]
